# Patient Record
Sex: Male | Born: 1985 | Race: Black or African American | Hispanic: No | Marital: Single | Smoking: Former smoker
Health system: Southern US, Community
[De-identification: ages and names within clinical notes are randomized; demographics above are authoritative.]

---

## 2007-07-14 ENCOUNTER — Emergency Department: Payer: Self-pay | Admitting: Internal Medicine

## 2008-08-03 ENCOUNTER — Emergency Department: Payer: Self-pay | Admitting: Emergency Medicine

## 2008-11-08 ENCOUNTER — Emergency Department: Payer: Self-pay | Admitting: Emergency Medicine

## 2010-11-11 ENCOUNTER — Emergency Department: Payer: Self-pay | Admitting: Emergency Medicine

## 2015-02-02 ENCOUNTER — Ambulatory Visit
Admission: EM | Admit: 2015-02-02 | Discharge: 2015-02-02 | Disposition: A | Discharge status: Home / Self Care | Payer: 59 | Attending: Internal Medicine | Admitting: Internal Medicine

## 2015-02-02 ENCOUNTER — Encounter: Payer: Self-pay | Admitting: Emergency Medicine

## 2015-02-02 ENCOUNTER — Ambulatory Visit: Payer: 59

## 2015-02-02 DIAGNOSIS — R091 Pleurisy: Secondary | ICD-10-CM

## 2015-02-02 DIAGNOSIS — J918 Pleural effusion in other conditions classified elsewhere: Secondary | ICD-10-CM | POA: Diagnosis not present

## 2015-02-02 DIAGNOSIS — R0789 Other chest pain: Secondary | ICD-10-CM | POA: Diagnosis not present

## 2015-02-02 DIAGNOSIS — Z87891 Personal history of nicotine dependence: Secondary | ICD-10-CM | POA: Diagnosis not present

## 2015-02-02 DIAGNOSIS — R0781 Pleurodynia: Secondary | ICD-10-CM | POA: Insufficient documentation

## 2015-02-02 DIAGNOSIS — R079 Chest pain, unspecified: Secondary | ICD-10-CM | POA: Diagnosis not present

## 2015-02-02 MED ORDER — IBUPROFEN 800 MG PO TABS: 800.0000 mg | ORAL_TABLET | Freq: Three times a day (TID) | ORAL | Status: DC | PRN

## 2015-02-02 MED ORDER — CYCLOBENZAPRINE HCL 5 MG PO TABS: 5.0000 mg | ORAL_TABLET | Freq: Three times a day (TID) | ORAL | Status: DC | PRN

## 2015-02-02 NOTE — ED Notes (Signed)
Patient c/o chest pain when he takes a deep breath that started this morning.  Patient denies SOB.  Patient denies N/V.

## 2015-02-02 NOTE — ED Provider Notes (Signed)
[facility] Emergency Department Provider Note  ____________________________________________  Time seen: Approximately 7:59 AM  I have reviewed the triage vital signs and the nursing notes.   HISTORY  Chief Complaint Chest Pain     HPI Jason Schwartz is a 29 y.o. male patient who presents for evaluation of right-sided chest pain onset this morning while at work. Patient denies any shortness of breath nausea or vomiting. Denies any sweating. Reports that he just hurt when he took a deep breath. He actually reports feeling better now then delete this morning. Describes pain as 2/10, nonradiating.   History reviewed. No pertinent past medical history.  There are no active problems to display for this patient.   History reviewed. No pertinent past surgical history.  No current outpatient prescriptions on file.  Allergies Review of patient's allergies indicates no known allergies.  History reviewed. No pertinent family history.  Social History History  Substance Use Topics  . Smoking status: Former Games developer  . Smokeless tobacco: Never Used  . Alcohol Use: Yes    Review of Systems Constitutional: No fever/chills Eyes: No visual changes. ENT: No sore throat. Cardiovascular: Minimal right sided chest pain Respiratory: Denies shortness of breath. Gastrointestinal: No abdominal pain.  No nausea, no vomiting.  No diarrhea.  No constipation. Genitourinary: Negative for dysuria. Musculoskeletal: Negative for back pain. Skin: Negative for rash. Neurological: Negative for headaches, focal weakness or numbness.  10-point ROS otherwise negative.  ____________________________________________   PHYSICAL EXAM:  VITAL SIGNS: ED Triage Vitals  Enc Vitals Group     BP 02/02/15 0750 129/87 mmHg     Pulse Rate 02/02/15 0750 57     Resp 02/02/15 0750 16     Temp 02/02/15 0750 96.9 F (36.1 C)     Temp Source 02/02/15 0750 Tympanic     SpO2 02/02/15  0750 100 %     Weight 02/02/15 0750 190 lb (86.183 kg)     Height 02/02/15 0750 6' (1.829 m)     Head Cir --      Peak Flow --      Pain Score 02/02/15 0752 2     Pain Loc --      Pain Edu? --      Excl. in GC? --     Constitutional: Alert and oriented. Well appearing and in no acute distress. Neck: No stridor.   Cardiovascular: Normal rate, regular rhythm. Grossly normal heart sounds.  Good peripheral circulation. Respiratory: Normal respiratory effort.  No retractions. Lungs CTAB. Musculoskeletal: No lower extremity tenderness nor edema.  No joint effusions. Neurologic:  Normal speech and language. No gross focal neurologic deficits are appreciated. Speech is normal. No gait instability. Skin:  Skin is warm, dry and intact. No rash noted. Psychiatric: Mood and affect are normal. Speech and behavior are normal.  ____________________________________________   LABS (all labs ordered are listed, but only abnormal results are displayed)  Labs Reviewed - No data to display ____________________________________________  EKG  Normal sinus rhythm no acute changes noted. ____________________________________________  RADIOLOGY  Interpreted by radiologist, reviewed by myself. Negative. ____________________________________________   PROCEDURES  Procedure(s) performed: None  Critical Care performed: No  ____________________________________________   INITIAL IMPRESSION / ASSESSMENT AND PLAN / ED COURSE  Pertinent labs & imaging results that were available during my care of the patient were reviewed by me and considered in my medical decision making (see chart for details).  Acute pleuritic chest pain. Rx with Motrin and milligrams 3 times a  day Flexeril 5 mg 3 times a day when necessary. Work excuse given times one day. Return to urgent care or ER if worsening symptomology. Patient voices no other emergency medical complaints at today's  visit. ____________________________________________   FINAL CLINICAL IMPRESSION(S) / ED DIAGNOSES  Final diagnoses:  None      Evangeline Dakin, PA-C 02/02/15 1610  Sharyn Creamer, MD 02/05/15 [id number]

## 2015-02-02 NOTE — Discharge Instructions (Signed)
Pleurisy °Pleurisy is redness, puffiness (swelling), and soreness (inflammation) of the lining of the lungs. It can be hard to breathe and hurt to breathe. Coughing or deep breathing will make it hurt more. It is often caused by an existing infection or disease.  °HOME CARE °· Only take medicine as told by your doctor. °· Only take antibiotic medicine as directed. Make sure to finish it even if you start to feel better. °GET HELP RIGHT AWAY IF:  °· Your lips, fingernails, or toenails are blue or dark. °· You cough up blood. °· You have a hard time breathing. °· Your pain is not controlled with medicine or it lasts for more than 1 week. °· Your pain spreads (radiates) into your neck, arms, or jaw. °· You are short of breath or wheezing. °· You develop a fever, rash, throw up (vomit), or faint. °MAKE SURE YOU:  °· Understand these instructions. °· Will watch your condition. °· Will get help right away if you are not doing well or get worse. °Document Released: 07/25/2008 Document Revised: 04/14/2013 Document Reviewed: 01/24/2013 °Exit[employer]® Patient Information ©2015 [employer]. This information is not intended to replace advice given to you by your health care provider. Make sure you discuss any questions you have with your health care provider. ° °

## 2016-03-27 ENCOUNTER — Ambulatory Visit
Admission: EM | Admit: 2016-03-27 | Discharge: 2016-03-27 | Disposition: A | Discharge status: Home / Self Care | Payer: 59 | Attending: Family Medicine | Admitting: Family Medicine

## 2016-03-27 ENCOUNTER — Encounter: Payer: Self-pay | Admitting: *Deleted

## 2016-03-27 ENCOUNTER — Ambulatory Visit (INDEPENDENT_AMBULATORY_CARE_PROVIDER_SITE_OTHER): Payer: 59

## 2016-03-27 DIAGNOSIS — M5412 Radiculopathy, cervical region: Secondary | ICD-10-CM

## 2016-03-27 MED ORDER — NAPROXEN 500 MG PO TABS: 500.0000 mg | ORAL_TABLET | Freq: Two times a day (BID) | ORAL | 0 refills | Status: DC

## 2016-03-27 MED ORDER — CYCLOBENZAPRINE HCL 5 MG PO TABS: ORAL_TABLET | ORAL | 0 refills | Status: DC

## 2016-03-27 NOTE — ED Provider Notes (Signed)
[facility]    CSN: [id number] Arrival date & time: 03/27/16  1030  First Provider Contact:  First MD Initiated Contact with Patient 03/27/16 1055        History   Chief Complaint Chief Complaint  Patient presents with  . Shoulder Pain    HPI Jason Schwartz is a 30 y.o. male.   HPI: Patient presents today with symptoms of left upper trap, shoulder, upper arm pain. Patient states that his symptoms worsen with movement of his head to the right. Patient states that the symptoms started when he was doing pull-ups. This was a few days ago. He denies any chest pain or shortness of breath. He denies any known cervical spine problems. He denies any trauma to the areas. He denies any weakness or tingling or numbness in the left upper extremity. He denies any change in color or temperature of the extremity. There has not been any swelling of the upper extremity. He has had some discomfort in the area with sleeping.  History reviewed. No pertinent past medical history.  There are no active problems to display for this patient.   History reviewed. No pertinent surgical history.     Home Medications    Prior to Admission medications   Medication Sig Start Date End Date Taking? Authorizing Provider  cyclobenzaprine (FLEXERIL) 5 MG tablet Take 1 tablet (5 mg total) by mouth every 8 (eight) hours as needed for muscle spasms. 02/02/15   Charmayne Sheer Beers, PA-C  ibuprofen (ADVIL,MOTRIN) 800 MG tablet Take 1 tablet (800 mg total) by mouth every 8 (eight) hours as needed. 02/02/15   Evangeline Dakin, PA-C    Family History History reviewed. No pertinent family history.  Social History Social History  Substance Use Topics  . Smoking status: Former Games developer  . Smokeless tobacco: Never Used  . Alcohol use Yes     Allergies   Review of patient's allergies indicates no known allergies.   Review of Systems Review of Systems: Give except mentioned above.   Physical Exam Triage  Vital Signs ED Triage Vitals  Enc Vitals Group     BP 03/27/16 1043 129/75     Pulse Rate 03/27/16 1043 75     Resp 03/27/16 1043 18     Temp 03/27/16 1043 97.6 F (36.4 C)     Temp Source 03/27/16 1043 Oral     SpO2 03/27/16 1043 100 %     Weight 03/27/16 1043 200 lb (90.7 kg)     Height 03/27/16 1043  (1.854 m)     Head Circumference --      Peak Flow --      Pain Score 03/27/16 1045 4     Pain Loc --      Pain Edu? --      Excl. in GC? --    No data found.   Updated Vital Signs BP 129/75 (BP Location: Right Arm)   Pulse 75   Temp 97.6 F (36.4 C) (Oral)   Resp 18   Ht  (1.854 m)   Wt 200 lb (90.7 kg)   SpO2 100%   BMI 26.39 kg/m       Physical Exam:  GENERAL: NAD RESP: CTA B CARD: RRR MSK: no midline tenderness of cervical spine, mild left trapezius tenderness extending into the posterior shoulder and upper arm, full range of motion of neck and upper extremities, normal strength of upper extremities, NV intact, there is mild reproduction of  symptoms with movement of the head to the right, negative Spurling's NEURO: CN II-XII grossly intact    UC Treatments / Results  Labs (all labs ordered are listed, but only abnormal results are displayed) Labs Reviewed - No data to display  EKG  EKG Interpretation None       Radiology No results found.  Procedures Procedures (including critical care time)  Medications Ordered in UC Medications - No data to display   Initial Impression / Assessment and Plan / UC Course  I have reviewed the triage vital signs and the nursing notes.  Pertinent labs & imaging results that were available during my care of the patient were reviewed by me and considered in my medical decision making (see chart for details).  Clinical Course   A/P: Left Cervical Radiculopathy- xrays show mild degenerative changes at C5-C6, recommend Naprosyn and Flexeril as needed, sleeping on proper sized pillow, further imaging if  symptoms persist or worsen, no upper extremity workouts for now.   Final Clinical Impressions(s) / UC Diagnoses   Final diagnoses:  None    New Prescriptions New Prescriptions   No medications on file     Jolene Provost, MD 03/27/16 1147

## 2016-03-27 NOTE — ED Triage Notes (Signed)
Patient was exercising 5 days ago and injured his left shoulder.

## 2018-04-13 ENCOUNTER — Ambulatory Visit (INDEPENDENT_AMBULATORY_CARE_PROVIDER_SITE_OTHER): Payer: 59

## 2018-04-13 ENCOUNTER — Ambulatory Visit
Admission: EM | Admit: 2018-04-13 | Discharge: 2018-04-13 | Disposition: A | Discharge status: Home / Self Care | Payer: 59 | Attending: Emergency Medicine | Admitting: Emergency Medicine

## 2018-04-13 ENCOUNTER — Other Ambulatory Visit: Payer: Self-pay

## 2018-04-13 ENCOUNTER — Encounter: Payer: Self-pay | Admitting: Emergency Medicine

## 2018-04-13 DIAGNOSIS — S6991XA Unspecified injury of right wrist, hand and finger(s), initial encounter: Secondary | ICD-10-CM

## 2018-04-13 NOTE — ED Triage Notes (Signed)
Patient states that he was playing basketball yesterday and injured his right 5th finger.  Patient c/o pain and swelling in his right 5th finger.

## 2018-04-13 NOTE — Discharge Instructions (Signed)
Take over-the-counter Tylenol and ibuprofen as needed.  Wear finger splint and buddy tape fourth and fifth fingers for 3 days, allowing to take off and stretch hand.  Ice and elevate.  Follow-up with orthopedic this week as needed for continued pain or complaints.  Return to urgent care as needed.

## 2018-04-13 NOTE — ED Provider Notes (Signed)
[facility] ____________________________________________  Time seen: Approximately 8:17 AM  I have reviewed the triage vital signs and the nursing notes.   HISTORY  Chief Complaint Hand Pain   HPI Jason Schwartz is a 32 y.o. male presenting for evaluation of right hand fifth digit pain since injury that occurred yesterday while playing basketball.  States that he accidentally hit right fifth finger on the ground causing pain.  Denies any other pain or injuries.  States around a year ago he had another similar injury to same finger in which he had a lot of pain, and reports since that initial injury he is never been able to fully extend his finger.  However states that the pain eventually went away but returned after injury yesterday.  Denies any numbness or loss of sensation.  Reports still unable to fully extend finger similarly to previous, but now with swelling to finger.  Denies any break in skin, other pain or under injuries.  No alleviating measures attempted prior to arrival.  Reports otherwise feels well.  Has not previously had that area evaluated.  States that he does a lot of heavy lifting and activity at work.  States pain is currently mild, worse with activity.   History reviewed. No pertinent past medical history.  There are no active problems to display for this patient.   History reviewed. No pertinent surgical history.   No current facility-administered medications for this encounter.  No current outpatient medications on file.  Allergies Patient has no known allergies.  History reviewed. No pertinent family history.  Social History Social History   Tobacco Use  . Smoking status: Former Games developermoker  . Smokeless tobacco: Never Used  Substance Use Topics  . Alcohol use: Yes  . Drug use: No    Review of Systems Constitutional: No fever/chills Cardiovascular: Denies chest pain. Respiratory: Denies shortness of breath. Gastrointestinal: No  abdominal pain.   Musculoskeletal: Negative for back pain. As above.  Skin: Negative for rash.  ____________________________________________   PHYSICAL EXAM:  VITAL SIGNS: ED Triage Vitals  Enc Vitals Group     BP 04/13/18 0813 132/83     Pulse Rate 04/13/18 0813 69     Resp 04/13/18 0813 16     Temp 04/13/18 0813 98.3 F (36.8 C)     Temp Source 04/13/18 0813 Oral     SpO2 04/13/18 0813 100 %     Weight 04/13/18 0809 195 lb (88.5 kg)     Height 04/13/18 0809 6\' 1"  (1.854 m)     Head Circumference --      Peak Flow --      Pain Score 04/13/18 0809 6     Pain Loc --      Pain Edu? --      Excl. in GC? --     Constitutional: Alert and oriented. Well appearing and in no acute distress. ENT      Head: Normocephalic and atraumatic. Cardiovascular: Normal rate, regular rhythm. Grossly normal heart sounds.  Good peripheral circulation. Respiratory: Normal respiratory effort without tachypnea nor retractions. Breath sounds are clear and equal bilaterally. No wheezes, rales, rhonchi. Musculoskeletal:  Steady gait.  Except: Right hand fifth digit at PIP joint mild to moderate edema and tenderness, no erythema, skin intact, able to mostly fully flex at PIP joint, unable to fully extend joint, good resisted flexion and extension distally, right hand otherwise nontender, normal distal sensation and capillary refill. Neurologic:  Normal speech and language. Speech is normal.  No gait instability.  Skin:  Skin is warm, dry. Psychiatric: Mood and affect are normal. Speech and behavior are normal. Patient exhibits appropriate insight and judgment   ___________________________________________   LABS (all labs ordered are listed, but only abnormal results are displayed)   RADIOLOGY  Dg Finger Little Right  Result Date: 04/13/2018 CLINICAL DATA:  Acute little finger pain following basketball injury yesterday. Initial encounter. EXAM: RIGHT LITTLE FINGER 2+V COMPARISON:  None. FINDINGS:  Fractures of the proximal and middle phalanges along the PIP joint noted have but a chronic appearance but correlate clinically. Soft tissue swelling is noted. No other acute fracture, subluxation or dislocation identified. IMPRESSION: Fractures of the proximal and middle phalanges along the PIP joint have a remote appearance, but correlate clinically given overlying soft tissue swelling. No other acute abnormality. Electronically Signed   By: Harmon PierJeffrey  Hu M.D.   On: 04/13/2018 08:44   ____________________________________________   PROCEDURES Procedures   INITIAL IMPRESSION / ASSESSMENT AND PLAN / ED COURSE  Pertinent labs & imaging results that were available during my care of the patient were reviewed by me and considered in my medical decision making (see chart for details).  Well-appearing patient.  No acute distress.  Presenting for evaluation of right fifth digit pain post injury.  Patient further reports that he had a remote injury to that same area with continued decreased range of motion.  X-ray as above per radiologist, fractures of the proximal and middle phalanges along the PIP joint but with soft tissue swelling.  Discussed with patient, suspect remote fracture with acute sprain injury.  As patient does do a lot of heavy lifting at work, work note given for today and tomorrow.  Finger splint applied and buddy tape fourth and fifth to wear for 2 to 3 days, ice, over-the-counter ibuprofen and supportive care.  Discussed stretching throughout the day.  Discussed and recommend follow-up with orthopedic.   Discussed follow up and return parameters including no resolution or any worsening concerns. Patient verbalized understanding and agreed to plan.   ____________________________________________   FINAL CLINICAL IMPRESSION(S) / ED DIAGNOSES  Final diagnoses:  Injury of right little finger, initial encounter     ED Discharge Orders    None       Note: This dictation was  prepared with Dragon dictation along with smaller phrase technology. Any transcriptional errors that result from this process are unintentional.         Renford DillsMiller, Zyiere Rosemond, NP 04/13/18 [phone]

## 2019-07-30 ENCOUNTER — Emergency Department
Admission: EM | Admit: 2019-07-30 | Discharge: 2019-07-30 | Disposition: A | Discharge status: Home / Self Care | Payer: Self-pay | Attending: Emergency Medicine | Admitting: Emergency Medicine

## 2019-07-30 ENCOUNTER — Other Ambulatory Visit: Payer: Self-pay

## 2019-07-30 ENCOUNTER — Encounter: Payer: Self-pay | Admitting: Emergency Medicine

## 2019-07-30 ENCOUNTER — Emergency Department: Payer: Self-pay

## 2019-07-30 DIAGNOSIS — G44209 Tension-type headache, unspecified, not intractable: Secondary | ICD-10-CM | POA: Insufficient documentation

## 2019-07-30 DIAGNOSIS — Z87891 Personal history of nicotine dependence: Secondary | ICD-10-CM | POA: Insufficient documentation

## 2019-07-30 MED ORDER — BUTALBITAL-APAP-CAFFEINE 50-325-40 MG PO TABS: 1.0000 | ORAL_TABLET | Freq: Four times a day (QID) | ORAL | 0 refills | Status: AC | PRN

## 2019-07-30 NOTE — Discharge Instructions (Signed)
Call and make an appointment with one of the clinics listed on your discharge papers.  You will need to be established with a primary care provider for continued treatment of your headaches and reevaluation.  CT scan was negative.  A prescription for Fioricet was sent to your pharmacy.  This medication could cause drowsiness and increase your risk for injury.  Do not drive or operate machinery while taking this medication.  Also among the clinics listed on your discharge papers is also information about the [facility] which is also free.

## 2019-07-30 NOTE — ED Triage Notes (Signed)
Patient to ER for c/o headache since Wednesday. Patient reports developing fever (100.0-101.0 range) and cold-like symptoms on Wednesday. States cold symptoms have resolved, but headache remains. Denies body aches.

## 2019-07-30 NOTE — ED Notes (Signed)
See triage note  Presents with h/a  States he had fever on weds  With h/a  States fever broke  But conts; to have left sided headache

## 2019-07-30 NOTE — ED Provider Notes (Signed)
[facility] Emergency Department Provider Note  ____________________________________________   First MD Initiated Contact with Patient 07/30/19 [phone]     (approximate)  I have reviewed the triage vital signs and the nursing notes.   HISTORY  Chief Complaint Headache   HPI [name] is a 33 y.o. male presents to the ED with complaint of headache that started 2 days ago.  Patient states that it is localized to the left side of his head.  He denies any visual changes, nausea, vomiting, diarrhea.  He states he has taken Tylenol once which helped temporarily with the symptoms.  Headache is not constant.  No previous history of headaches.  He states that 2 days ago he had a fever with some cold-like symptoms which have resolved now.  He states that headache still remains on the left side of his head.  He denies any body aches.  He is unaware of any exposure to Covid.  He rates his headache as a 6 out of 10.       History reviewed. No pertinent past medical history.  There are no active problems to display for this patient.   History reviewed. No pertinent surgical history.  Prior to Admission medications   Medication Sig Start Date End Date Taking? Authorizing Provider  butalbital-acetaminophen-caffeine (FIORICET) 50-325-40 MG tablet Take 1-2 tablets by mouth every 6 (six) hours as needed for headache. 07/30/19 07/29/20  Tommi RumpsSummers, Rontae Inglett L, PA-C    Allergies Patient has no known allergies.  No family history on file.  Social History Social History   Tobacco Use   Smoking status: Former Smoker   Smokeless tobacco: Never Used  Substance Use Topics   Alcohol use: Yes   Drug use: No    Review of Systems Constitutional: Resolved subjective fever/chills Eyes: No visual changes.  No photophobia. ENT: No sore throat. Cardiovascular: Denies chest pain. Respiratory: Denies shortness of breath.  Negative for coughing. Gastrointestinal: No  abdominal pain.  No nausea, no vomiting.  No diarrhea.   Genitourinary: Negative for dysuria. Musculoskeletal: Negative for muscle aches. Skin: Negative for rash. Neurological: Positive for left-sided headache.  No focal weakness or numbness. ____________________________________________   PHYSICAL EXAM:  VITAL SIGNS: ED Triage Vitals  Enc Vitals Group     BP 07/30/19 0706 (!) 148/83     Pulse Rate 07/30/19 0706 73     Resp 07/30/19 0706 20     Temp 07/30/19 0706 99.2 F (37.3 C)     Temp Source 07/30/19 0706 Oral     SpO2 07/30/19 0706 100 %     Weight 07/30/19 0708 200 lb (90.7 kg)     Height 07/30/19 0708 6\' 1"  (1.854 m)     Head Circumference --      Peak Flow --      Pain Score 07/30/19 0706 6     Pain Loc --      Pain Edu? --      Excl. in GC? --    Constitutional: Alert and oriented. Well appearing and in no acute distress.  Nontoxic in appearance. Eyes: Conjunctivae are normal. PERRL. EOMI. Head: Atraumatic. Nose: No congestion/rhinnorhea.  TMs dull bilaterally.  Sinuses are nontender to percussion both frontal and maxillary areas. Mouth/Throat: Mucous membranes are moist.  Oropharynx non-erythematous.  No erythema or exudate is noted.  Uvula is midline. Neck: No stridor.  No cervical tenderness on palpation posteriorly.  Patient is able to flex and extend without any difficulty or increased  pain.  No nuchal rigidity.  There is minimal tenderness on palpation of the left trapezius muscle but no evidence of injury or soft tissue edema. Hematological/Lymphatic/Immunilogical: No cervical lymphadenopathy. Cardiovascular: Normal rate, regular rhythm. Grossly normal heart sounds.  Good peripheral circulation. Respiratory: Normal respiratory effort.  No retractions. Lungs CTAB. Musculoskeletal: Moves upper and lower extremities with any difficulty.  Normal gait was noted.  Good muscle strength bilaterally. Neurologic:  Normal speech and language. No gross focal neurologic  deficits are appreciated.  Cranial nerves II through XII grossly intact.  No gait instability. Skin:  Skin is warm, dry and intact. No rash noted. Psychiatric: Mood and affect are normal. Speech and behavior are normal.  ____________________________________________   LABS (all labs ordered are listed, but only abnormal results are displayed)  Labs Reviewed - No data to display  RADIOLOGY  Official radiology report(s): Ct Head Wo Contrast  Result Date: 07/30/2019 CLINICAL DATA:  Left temporal headache for 24 hours.  No injury. EXAM: CT HEAD WITHOUT CONTRAST TECHNIQUE: Contiguous axial images were obtained from the base of the skull through the vertex without intravenous contrast. COMPARISON:  None. FINDINGS: Brain: No evidence of acute infarction, hemorrhage, hydrocephalus, extra-axial collection or mass lesion/mass effect. Vascular: No hyperdense vessel is noted. Skull: Normal. Negative for fracture or focal lesion. Sinuses/Orbits: The orbits are normal. Minimal mucoperiosteal thickening of the left ethmoid sinus are noted. Other: None. IMPRESSION: No focal acute intracranial abnormality identified. Minimal mucoperiosteal thickening of left ethmoid sinus. Electronically Signed   By: Sherian Rein M.D.   On: 07/30/2019 08:23    ____________________________________________   PROCEDURES  Procedure(s) performed (including Critical Care):  Procedures  ____________________________________________   INITIAL IMPRESSION / ASSESSMENT AND PLAN / ED COURSE  As part of my medical decision making, I reviewed the following data within the electronic MEDICAL RECORD NUMBER Notes from prior ED visits and [place] Controlled Substance Database  ULICES MAACK was evaluated in Emergency Department on 07/30/2019 for the symptoms described in the history of present illness. He was evaluated in the context of the global COVID-19 pandemic, which necessitated consideration that the patient might be at risk for  infection with the SARS-CoV-2 virus that causes COVID-19. Institutional protocols and algorithms that pertain to the evaluation of patients at risk for COVID-19 are in a state of rapid change based on information released by regulatory bodies including the CDC and federal and state organizations. These policies and algorithms were followed during the patient's care in the ED.  33 year old male presents to the ED with complaint of left sided headache for 2 days.  Patient states that headache comes and goes.  He reports he has taken Tylenol once which helped however the headache returned.  He denies any photophobia, nausea, vomiting, visual changes or dizziness.  There has been no injury to his head.  Patient reports a subjective fever of 1 day that has now resolved.  Patient denies any exposure to Covid.  Physical exam was unremarkable and neurologically intact.  CT scan was negative for any acute changes.  Patient was reassured.  A prescription Fioricet was prescribed for him for his headaches.  Patient was encouraged to follow-up with his PCP.  His return to the emergency department if any severe worsening of his symptoms.  Asked questions about the free Covid testing and was given information where he prefers to have it done.   FINAL CLINICAL IMPRESSION(S) / ED DIAGNOSES  Final diagnoses:  Muscle contraction headache     ED  Discharge Orders         Ordered    butalbital-acetaminophen-caffeine (FIORICET) 50-325-40 MG tablet  Every 6 hours PRN     07/30/19 0848           Note:  This document was prepared using Dragon voice recognition software and may include unintentional dictation errors.    Johnn Hai, PA-C 07/30/19 1020    Vanessa Chapman, MD 07/31/19 [phone]

## 2021-04-18 IMAGING — CT CT HEAD W/O CM
3 series · 16 of 47 positions shown, 19 images · non-contrast
Comparison: None.

CLINICAL DATA: Left temporal headache for 24 hours.  No injury.

EXAM:
CT HEAD WITHOUT CONTRAST
TECHNIQUE: Contiguous axial images were obtained from the base of the skull
through the vertex without intravenous contrast.

[Series 2: head wo · axial · 0.43mm/px · z∈[-45,+80]mm · 10 of 31 slices shown, 13 images]
[im 3/31  brain]
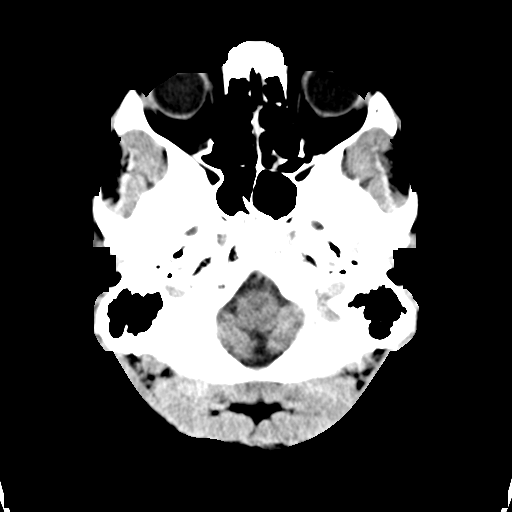
[im 3/31  bone]
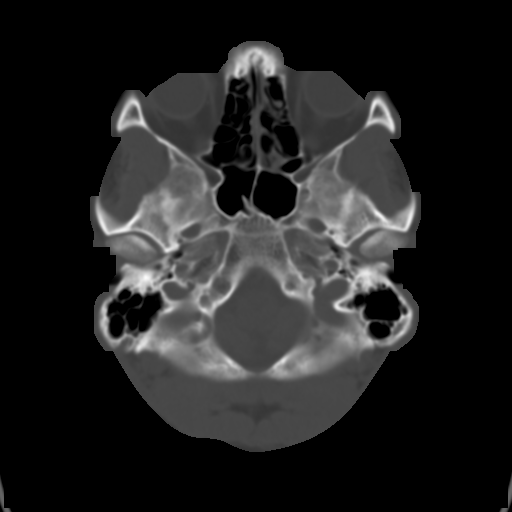
[im 6/31  brain]
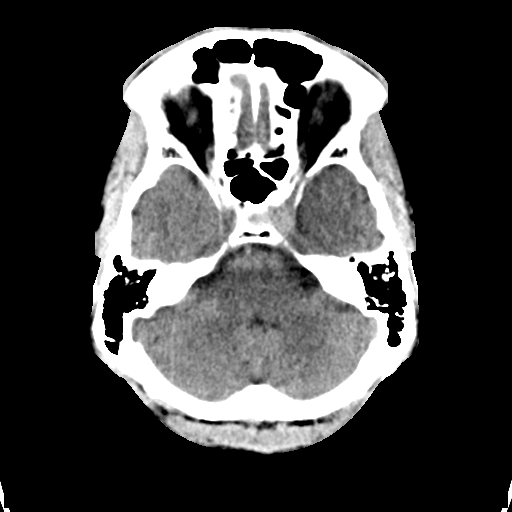
[im 9/31  brain]
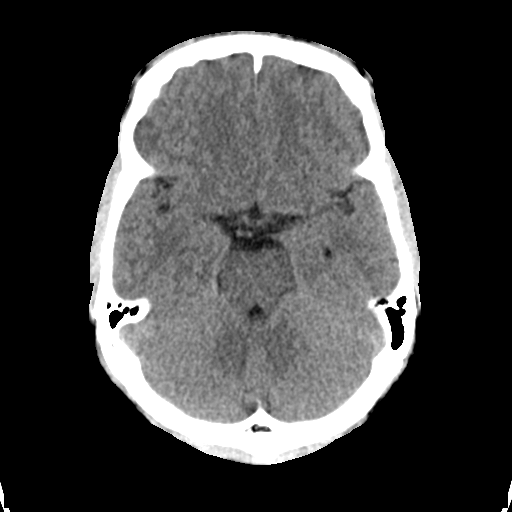
[im 11/31  brain]
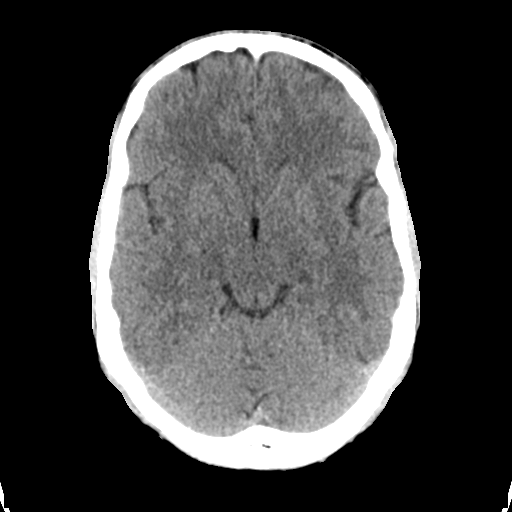
[im 14/31  brain]
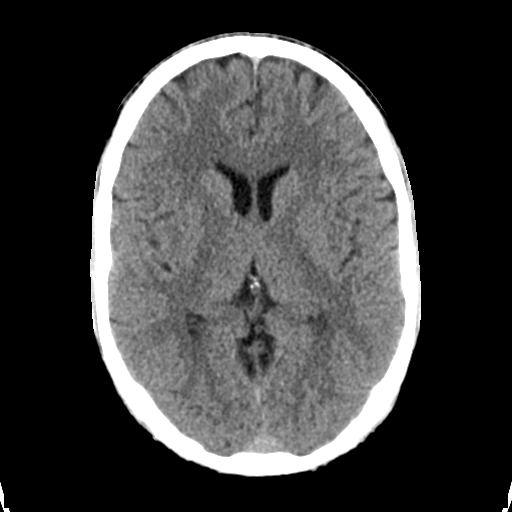
[im 14/31  bone]
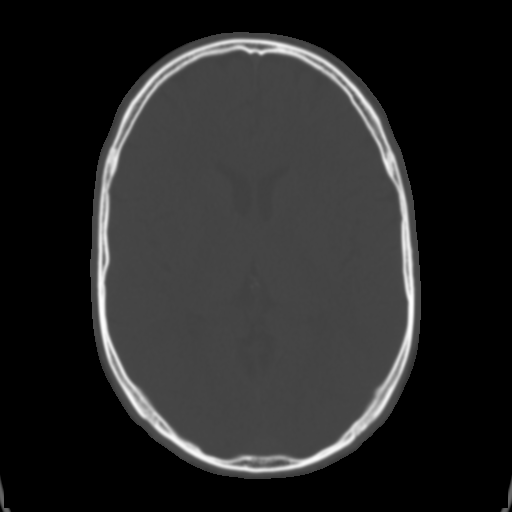
[im 17/31  brain]
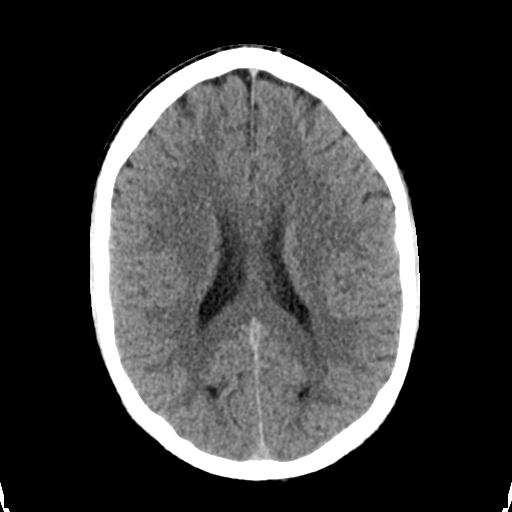
[im 20/31  brain]
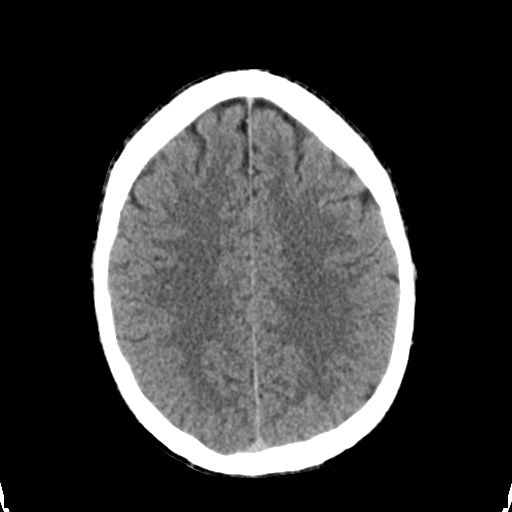
[im 23/31  brain]
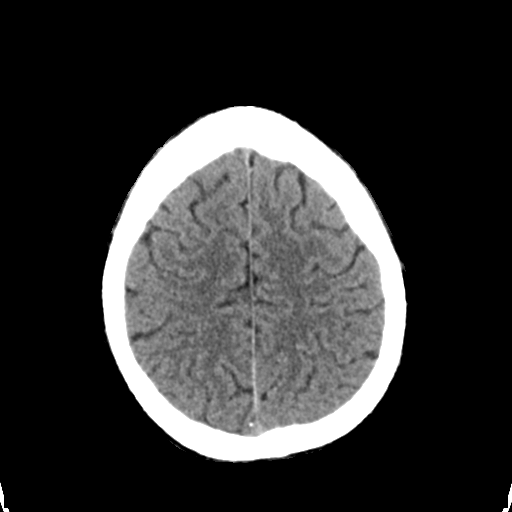
[im 25/31  brain]
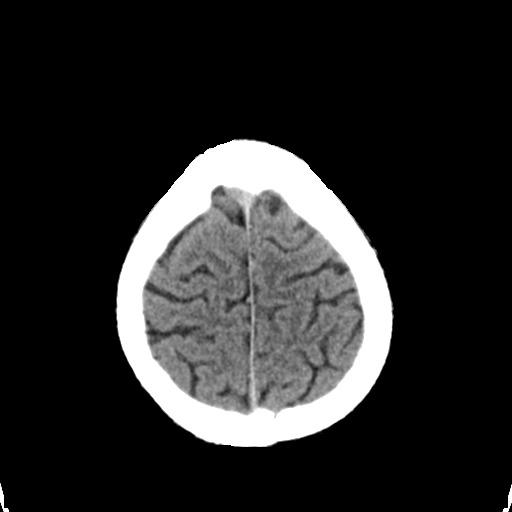
[im 25/31  bone]
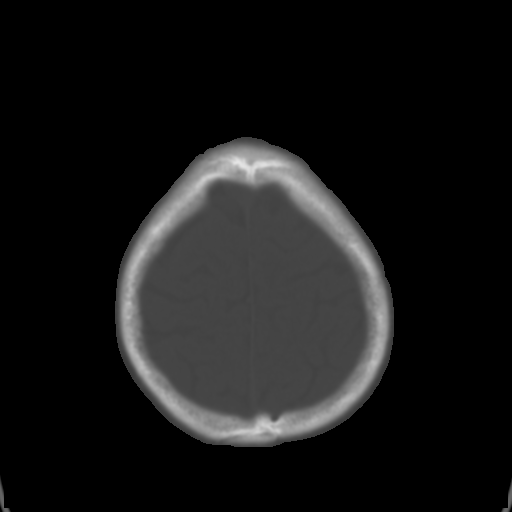
[im 28/31  brain]
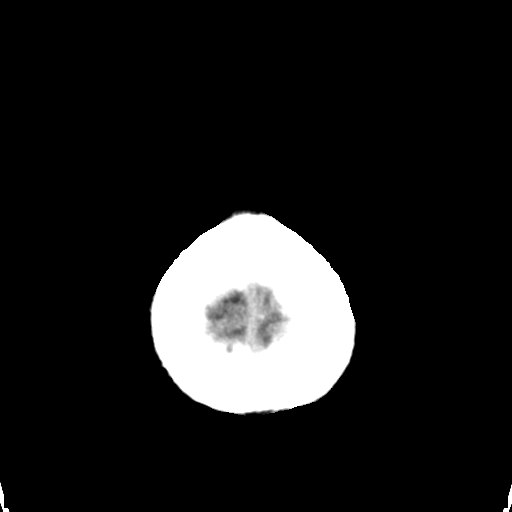

[Series 4: coronal soft tissue · coronal · 0.30mm/px · 3 of 66 slices shown]
[im 22/66  brain]
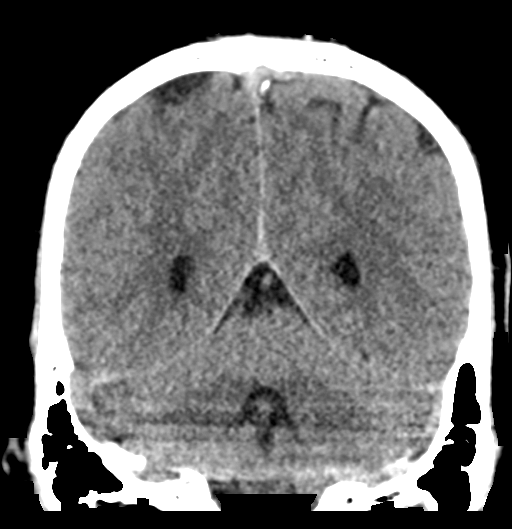
[im 29/66  brain]
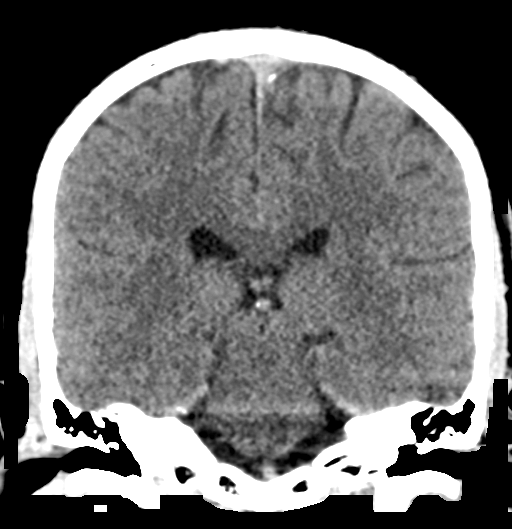
[im 37/66  brain]
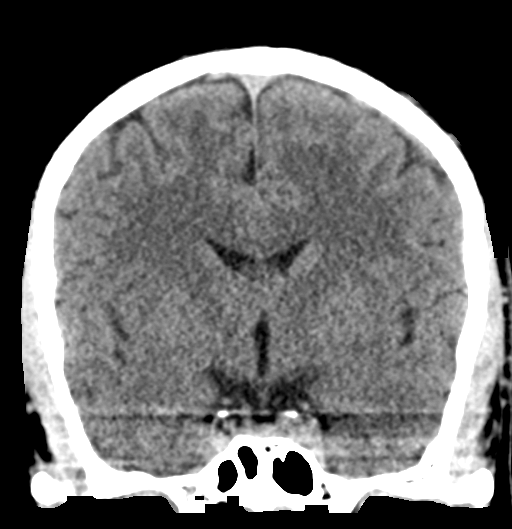

[Series 5: sagittal soft tissue · sagittal · 0.34mm/px · 3 of 53 slices shown]
[im 18/53  brain]
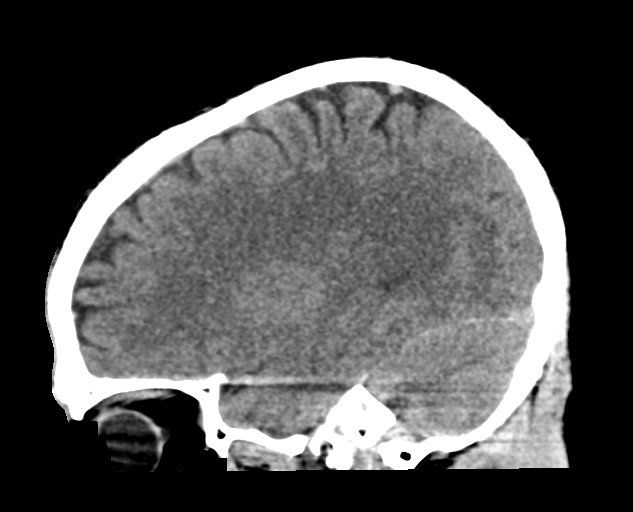
[im 27/53  brain]
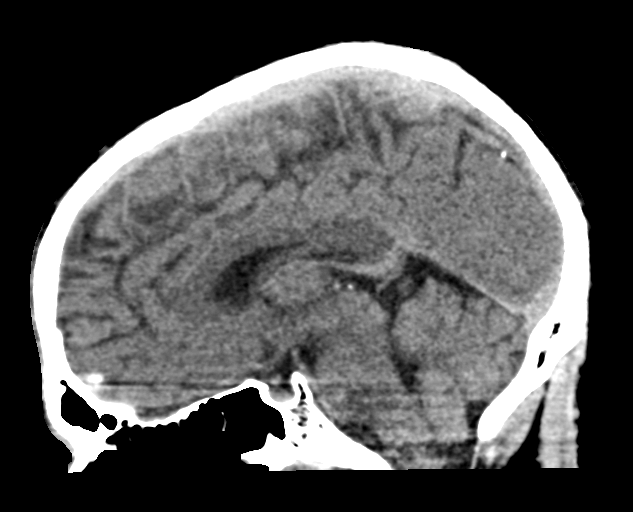
[im 35/53  brain]
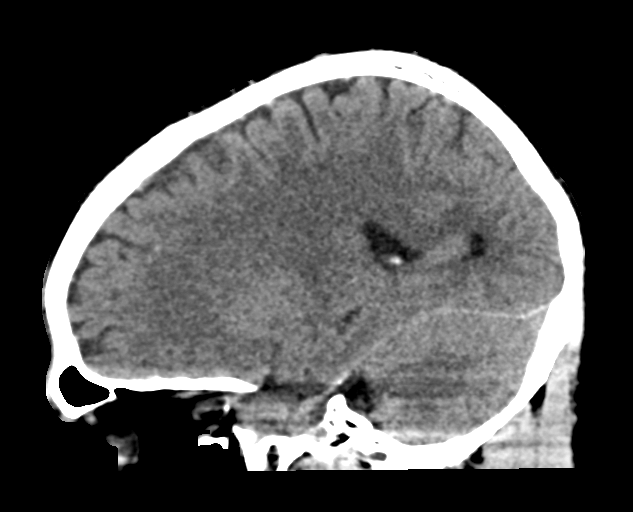

[16 of 47 positions shown; findings below may reference images not displayed]

FINDINGS: Brain: No evidence of acute infarction, hemorrhage, hydrocephalus,
extra-axial collection or mass lesion/mass effect.

Vascular: No hyperdense vessel is noted.

Skull: Normal. Negative for fracture or focal lesion.

Sinuses/Orbits: The orbits are normal. Minimal mucoperiosteal
thickening of the left ethmoid sinus are noted.

Other: None.
IMPRESSION: No focal acute intracranial abnormality identified.

Minimal mucoperiosteal thickening of left ethmoid sinus.
